# Patient Record
Sex: Male | Born: 1965 | Race: Black or African American | Hispanic: No | Marital: Married | Smoking: Never smoker
Health system: Southern US, Community
[De-identification: ages and names within clinical notes are randomized; demographics above are authoritative.]

## PROBLEM LIST (undated history)

## (undated) DIAGNOSIS — I639 Cerebral infarction, unspecified: Secondary | ICD-10-CM

## (undated) DIAGNOSIS — I10 Essential (primary) hypertension: Secondary | ICD-10-CM

## (undated) DIAGNOSIS — I2510 Atherosclerotic heart disease of native coronary artery without angina pectoris: Secondary | ICD-10-CM

## (undated) DIAGNOSIS — J329 Chronic sinusitis, unspecified: Secondary | ICD-10-CM

## (undated) HISTORY — PX: NASAL SINUS SURGERY: SHX719

## (undated) HISTORY — PX: KNEE ARTHROSCOPY: SUR90

## (undated) HISTORY — PX: VASECTOMY: SHX75

---

## 2010-02-05 ENCOUNTER — Emergency Department (HOSPITAL_COMMUNITY): Admission: EM | Admit: 2010-02-05 | Discharge: 2010-02-05 | Payer: Self-pay | Admitting: Emergency Medicine

## 2012-02-16 ENCOUNTER — Emergency Department (HOSPITAL_BASED_OUTPATIENT_CLINIC_OR_DEPARTMENT_OTHER)
Admission: EM | Admit: 2012-02-16 | Discharge: 2012-02-16 | Disposition: A | Discharge status: Home / Self Care | Payer: 59 | Attending: Emergency Medicine | Admitting: Emergency Medicine

## 2012-02-16 ENCOUNTER — Encounter (HOSPITAL_BASED_OUTPATIENT_CLINIC_OR_DEPARTMENT_OTHER): Payer: Self-pay | Admitting: Emergency Medicine

## 2012-02-16 DIAGNOSIS — I10 Essential (primary) hypertension: Secondary | ICD-10-CM | POA: Insufficient documentation

## 2012-02-16 DIAGNOSIS — Z79899 Other long term (current) drug therapy: Secondary | ICD-10-CM | POA: Insufficient documentation

## 2012-02-16 DIAGNOSIS — M549 Dorsalgia, unspecified: Secondary | ICD-10-CM | POA: Insufficient documentation

## 2012-02-16 DIAGNOSIS — E119 Type 2 diabetes mellitus without complications: Secondary | ICD-10-CM | POA: Insufficient documentation

## 2012-02-16 DIAGNOSIS — R109 Unspecified abdominal pain: Secondary | ICD-10-CM | POA: Insufficient documentation

## 2012-02-16 HISTORY — DX: Essential (primary) hypertension: I10

## 2012-02-16 HISTORY — DX: Cerebral infarction, unspecified: I63.9

## 2012-02-16 LAB — COMPREHENSIVE METABOLIC PANEL
Alkaline Phosphatase: 106 U/L (ref 39–117)
BUN: 7 mg/dL (ref 6–23)
GFR calc Af Amer: >90 mL/min (ref >90)
GFR calc non Af Amer: >90 mL/min (ref >90)
Glucose, Bld: 123 mg/dL — ABNORMAL HIGH (ref 70–99)
Potassium: 3.5 mEq/L (ref 3.5–5.1)
Total Bilirubin: 0.5 mg/dL (ref 0.3–1.2)
Total Protein: 8 g/dL (ref 6.0–8.3)

## 2012-02-16 LAB — DIFFERENTIAL
Basophils Absolute: 0 10*3/uL (ref 0.0–0.1)
Lymphocytes Relative: 44 % (ref 12–46)
Neutro Abs: 2.7 10*3/uL (ref 1.7–7.7)
Neutrophils Relative %: 40 % — ABNORMAL LOW (ref 43–77)

## 2012-02-16 LAB — CBC
HCT: 42.3 % (ref 39.0–52.0)
Platelets: 202 10*3/uL (ref 150–400)
RDW: 14.9 % (ref 11.5–15.5)
WBC: 6.7 10*3/uL (ref 4.0–10.5)

## 2012-02-16 MED ORDER — HYDROMORPHONE HCL PF 1 MG/ML IJ SOLN
1.0000 mg | Freq: Once | INTRAMUSCULAR | Status: AC
  Administered 2012-02-16: 1 mg via INTRAVENOUS
  Filled 2012-02-16: qty 1

## 2012-02-16 MED ORDER — OXYCODONE-ACETAMINOPHEN 5-325 MG PO TABS: 2.0000 | ORAL_TABLET | ORAL | Status: AC | PRN

## 2012-02-16 MED ORDER — SODIUM CHLORIDE 0.9 % IV SOLN
Freq: Once | INTRAVENOUS | Status: AC
  Administered 2012-02-16: 17:00:00 via INTRAVENOUS

## 2012-02-16 MED ORDER — ONDANSETRON HCL 4 MG/2ML IJ SOLN
4.0000 mg | Freq: Once | INTRAMUSCULAR | Status: AC
  Administered 2012-02-16: 4 mg via INTRAVENOUS
  Filled 2012-02-16: qty 2

## 2012-02-16 NOTE — ED Provider Notes (Signed)
History     CSN: [id number]  Arrival date & time 02/16/12  1516   First MD Initiated Contact with Patient 02/16/12 1606      Chief Complaint  Patient presents with  . Abdominal Pain    (Consider location/radiation/quality/duration/timing/severity/associated sxs/prior treatment) Patient is a 46 y.o. male presenting with abdominal pain. The history is provided by the patient. No language interpreter was used.  Abdominal Pain The primary symptoms of the illness include abdominal pain. The current episode started 2 days ago. The onset of the illness was sudden. The problem has been gradually worsening.  The illness is associated with eating. The patient has not had a change in bowel habit. Additional symptoms associated with the illness include back pain. Significant associated medical issues include liver disease. Significant associated medical issues do not include PUD, GERD, inflammatory bowel disease or diabetes.  Pt reports he began having abdominal pain after eating pizza.  Pt was seen at the [facility] and given hydrocodone.  Pt was told his liver functions were elevated.  Pt was advised that he needed an MRi to evaluate for gallbladder duct problems.  Pt reports no relief from pain medications  Past Medical History  Diagnosis Date  . Diabetes mellitus   . Hypertension   . Stroke     History reviewed. No pertinent past surgical history.  History reviewed. No pertinent family history.  History  Substance Use Topics  . Smoking status: Not on file  . Smokeless tobacco: Not on file  . Alcohol Use:       Review of Systems  Gastrointestinal: Positive for abdominal pain.  Musculoskeletal: Positive for back pain.  All other systems reviewed and are negative.    Allergies  Review of patient's allergies indicates no known allergies.  Home Medications   Current Outpatient Rx  Name Route Sig Dispense Refill  . AMLODIPINE BESYLATE 10 MG PO TABS Oral Take 5 mg by mouth daily.    Marland Kitchen  VITAMIN D 1000 UNITS PO TABS Oral Take 1,000 Units by mouth daily.    . CYANOCOBALAMIN 1000 MCG PO TABS Oral Take 1,000 mcg by mouth daily.    Marland Kitchen HYDROCODONE-ACETAMINOPHEN 5-500 MG PO TABS Oral Take 1 tablet by mouth 3 (three) times daily as needed. For pain    . LISINOPRIL 20 MG PO TABS Oral Take 10 mg by mouth daily.    Marland Kitchen METFORMIN HCL 500 MG PO TABS Oral Take 500 mg by mouth 2 (two) times daily.    Marland Kitchen ONDANSETRON HCL 8 MG PO TABS Oral Take 8 mg by mouth 3 (three) times daily as needed. For nausea    . OXYCODONE-ACETAMINOPHEN 5-325 MG PO TABS Oral Take 1 tablet by mouth every 4 (four) hours as needed. For pain    . PRAVASTATIN SODIUM 40 MG PO TABS Oral Take 40 mg by mouth every evening.    Marland Kitchen SERTRALINE HCL 100 MG PO TABS Oral Take 100 mg by mouth daily.    Marland Kitchen ZOLPIDEM TARTRATE 10 MG PO TABS Oral Take 5 mg by mouth at bedtime as needed. For sleep      BP 148/89  Pulse 83  Temp(Src) 98.4 F (36.9 C) (Oral)  Resp 22  SpO2 99%  Physical Exam  Nursing note and vitals reviewed. Constitutional: He appears well-developed and well-nourished.  HENT:  Head: Normocephalic and atraumatic.  Right Ear: External ear normal.  Left Ear: External ear normal.  Nose: Nose normal.  Mouth/Throat: Oropharynx is clear and moist.  Eyes:  Conjunctivae and EOM are normal. Pupils are equal, round, and reactive to light.  Neck: Normal range of motion. Neck supple.  Cardiovascular: Normal rate.   Pulmonary/Chest: Effort normal.  Abdominal: Soft.  Musculoskeletal: Normal range of motion.  Neurological: He is alert.  Skin: Skin is warm.  Psychiatric: He has a normal mood and affect.    ED Course  Procedures (including critical care time)  Labs Reviewed - No data to display No results found.   No diagnosis found.    MDM   Results for orders placed during the hospital encounter of 02/16/12  LIPASE, BLOOD      Component Value Range   Lipase 16  11 - 59 (U/L)  COMPREHENSIVE METABOLIC PANEL       Component Value Range   Sodium 138  135 - 145 (mEq/L)   Potassium 3.5  3.5 - 5.1 (mEq/L)   Chloride 101  96 - 112 (mEq/L)   CO2 30  19 - 32 (mEq/L)   Glucose, Bld 123 (*) 70 - 99 (mg/dL)   BUN 7  6 - 23 (mg/dL)   Creatinine, Ser 4.09  0.50 - 1.35 (mg/dL)   Calcium 9.3  8.4 - 81.1 (mg/dL)   Total Protein 8.0  6.0 - 8.3 (g/dL)   Albumin 3.8  3.5 - 5.2 (g/dL)   AST 35  0 - 37 (U/L)   ALT 94 (*) 0 - 53 (U/L)   Alkaline Phosphatase 106  39 - 117 (U/L)   Total Bilirubin 0.5  0.3 - 1.2 (mg/dL)   GFR calc non Af Amer >90  >90 (mL/min)   GFR calc Af Amer >90  >90 (mL/min)  CBC      Component Value Range   WBC 6.7  4.0 - 10.5 (K/uL)   RBC 5.17  4.22 - 5.81 (MIL/uL)   Hemoglobin 14.1  13.0 - 17.0 (g/dL)   HCT 91.4  78.2 - 95.6 (%)   MCV 81.8  78.0 - 100.0 (fL)   MCH 27.3  26.0 - 34.0 (pg)   MCHC 33.3  30.0 - 36.0 (g/dL)   RDW 21.3  08.6 - 57.8 (%)   Platelets 202  150 - 400 (K/uL)  DIFFERENTIAL      Component Value Range   Neutrophils Relative 40 (*) 43 - 77 (%)   Neutro Abs 2.7  1.7 - 7.7 (K/uL)   Lymphocytes Relative 44  12 - 46 (%)   Lymphs Abs 2.9  0.7 - 4.0 (K/uL)   Monocytes Relative 8  3 - 12 (%)   Monocytes Absolute 0.5  0.1 - 1.0 (K/uL)   Eosinophils Relative 8 (*) 0 - 5 (%)   Eosinophils Absolute 0.5  0.0 - 0.7 (K/uL)   Basophils Relative 0  0 - 1 (%)   Basophils Absolute 0.0  0.0 - 0.1 (K/uL)   No results found. Dr.Wickline in to see and examine.  [facility]  No gallstones,  They recommended mrcp.    [facility].        Lonia Skinner Robbins, [place] 02/16/12 1827

## 2012-02-16 NOTE — ED Notes (Signed)
Pt had abdominal CT on Friday with abnormalities, scheduled for MRI tomorrow at [facility].  Pt having pain uncontrolled by meds given on Friday.

## 2012-02-16 NOTE — ED Provider Notes (Signed)
Pt resting comfortably abd soft and no focal tenderness [facility], I doubt acute abd process He denies active CP/SOB BP 148/89  Pulse 83  Temp(Src) 98.4 F (36.9 C) (Oral)  Resp 22  SpO2 99%   Joya Gaskins, MD 02/16/12 1759

## 2012-02-16 NOTE — ED Notes (Signed)
Spoke with Kerby Nora for second time at [facility] about copies of records for pt., advised that pt. Was currently in ED being seen.

## 2012-02-16 NOTE — Discharge Instructions (Signed)

## 2012-02-16 NOTE — ED Notes (Addendum)
Pt told respiratory therapist that he wanted his IV moved from his [facility] to his hand because it was uncomfortable.  Upon entering room pt was found bent over the head of the bed with SO standing at bedside rubbing his back.  Pt states that pain is unbearable, no diaphoresis, no nausea, no vomiting, speaking in complete sentences.  Pt was informed that it would be advisable to leave PIV to avoid having to be restuck if his clinical situation dictated need for PIV in the [facility].  Pt voiced understanding and agreed to leave PIV in place at this time.

## 2012-02-17 NOTE — ED Provider Notes (Signed)
Medical screening examination/treatment/procedure(s) were conducted as a shared visit with non-physician practitioner(s) and myself.  I personally evaluated the patient during the encounter  Pt well appearing, no distress, abd soft, reports this has been going on for "awhile"  Joya Gaskins, MD 02/17/12 [phone]

## 2013-03-22 ENCOUNTER — Emergency Department (HOSPITAL_BASED_OUTPATIENT_CLINIC_OR_DEPARTMENT_OTHER)
Admission: EM | Admit: 2013-03-22 | Discharge: 2013-03-22 | Disposition: A | Discharge status: Home / Self Care | Payer: 59 | Attending: Emergency Medicine | Admitting: Emergency Medicine

## 2013-03-22 ENCOUNTER — Encounter (HOSPITAL_BASED_OUTPATIENT_CLINIC_OR_DEPARTMENT_OTHER): Payer: Self-pay | Admitting: *Deleted

## 2013-03-22 DIAGNOSIS — M545 Low back pain, unspecified: Secondary | ICD-10-CM | POA: Insufficient documentation

## 2013-03-22 DIAGNOSIS — Z7901 Long term (current) use of anticoagulants: Secondary | ICD-10-CM | POA: Insufficient documentation

## 2013-03-22 DIAGNOSIS — Z8673 Personal history of transient ischemic attack (TIA), and cerebral infarction without residual deficits: Secondary | ICD-10-CM | POA: Insufficient documentation

## 2013-03-22 DIAGNOSIS — Z79899 Other long term (current) drug therapy: Secondary | ICD-10-CM | POA: Insufficient documentation

## 2013-03-22 DIAGNOSIS — E119 Type 2 diabetes mellitus without complications: Secondary | ICD-10-CM | POA: Insufficient documentation

## 2013-03-22 DIAGNOSIS — I10 Essential (primary) hypertension: Secondary | ICD-10-CM | POA: Insufficient documentation

## 2013-03-22 LAB — URINALYSIS, ROUTINE W REFLEX MICROSCOPIC
Leukocytes, UA: NEGATIVE
Nitrite: NEGATIVE
Specific Gravity, Urine: 1.024 (ref 1.005–1.030)
Urobilinogen, UA: 1 mg/dL (ref 0.0–1.0)
pH: 6 (ref 5.0–8.0)

## 2013-03-22 MED ORDER — OXYCODONE-ACETAMINOPHEN 5-325 MG PO TABS: 1.0000 | ORAL_TABLET | Freq: Four times a day (QID) | ORAL | Status: DC | PRN

## 2013-03-22 MED ORDER — HYDROMORPHONE HCL PF 2 MG/ML IJ SOLN
2.0000 mg | Freq: Once | INTRAMUSCULAR | Status: AC
  Administered 2013-03-22: 2 mg via INTRAMUSCULAR
  Filled 2013-03-22: qty 1

## 2013-03-22 NOTE — ED Notes (Addendum)
Pt states he went to [employer] Friday and jumped on the trampoline about 10 min. "Woke up Sat crying". Has had Ibuprofen and Vicodin without relief. Denies any "new" numbness or tingling to ext. Pt is on blood thinner for "blood clots"

## 2013-03-22 NOTE — ED Provider Notes (Signed)
History     CSN: [id number]  Arrival date & time 03/22/13  0057   First MD Initiated Contact with Patient 03/22/13 0347      Chief Complaint  Patient presents with  . Back Pain    (Consider location/radiation/quality/duration/timing/severity/associated sxs/prior treatment) HPI This is a 47 year old male who was playing on a trampoline 2 days ago. Yesterday going he awoke with severe pain in his right flank and lower back. He describes the pain as "a catch". There is only slight radiation down the back of the right leg. The pain is worse with movement and certain positions. There is no associated numbness, weakness or bowel or bladder changes.  Past Medical History  Diagnosis Date  . Diabetes mellitus   . Hypertension   . Stroke     History reviewed. No pertinent past surgical history.  History reviewed. No pertinent family history.  History  Substance Use Topics  . Smoking status: Never Smoker   . Smokeless tobacco: Not on file  . Alcohol Use: Yes      Review of Systems  All other systems reviewed and are negative.    Allergies  Review of patient's allergies indicates no known allergies.  Home Medications   Current Outpatient Rx  Name  Route  Sig  Dispense  Refill  . warfarin (COUMADIN) 7.5 MG tablet   Oral   Take 7.5 mg by mouth daily.         Marland Kitchen amLODipine (NORVASC) 10 MG tablet   Oral   Take 5 mg by mouth daily.         . cholecalciferol (VITAMIN D) 1000 UNITS tablet   Oral   Take 1,000 Units by mouth daily.         . cyanocobalamin 1000 MCG tablet   Oral   Take 1,000 mcg by mouth daily.         Marland Kitchen HYDROcodone-acetaminophen (VICODIN) 5-500 MG per tablet   Oral   Take 1 tablet by mouth 3 (three) times daily as needed. For pain         . lisinopril (PRINIVIL,ZESTRIL) 20 MG tablet   Oral   Take 10 mg by mouth daily.         . metFORMIN (GLUCOPHAGE) 500 MG tablet   Oral   Take 500 mg by mouth 2 (two) times daily.         .  ondansetron (ZOFRAN) 8 MG tablet   Oral   Take 8 mg by mouth 3 (three) times daily as needed. For nausea         . oxyCODONE-acetaminophen (PERCOCET) 5-325 MG per tablet   Oral   Take 1 tablet by mouth every 4 (four) hours as needed. For pain         . pravastatin (PRAVACHOL) 40 MG tablet   Oral   Take 40 mg by mouth every evening.         . sertraline (ZOLOFT) 100 MG tablet   Oral   Take 100 mg by mouth daily.         Marland Kitchen zolpidem (AMBIEN) 10 MG tablet   Oral   Take 5 mg by mouth at bedtime as needed. For sleep           BP 138/81  Pulse 64  Temp(Src) 98.1 F (36.7 C) (Oral)  Resp 20  Ht 5\' 11"  (1.803 m)  Wt 238 lb (107.956 kg)  BMI 33.21 kg/m2  SpO2 96%  Physical Exam General: Well-developed, well-nourished  male in no acute distress; appearance consistent with age of record HENT: normocephalic, atraumatic Eyes: pupils equal round and reactive to light; extraocular muscles intact Neck: supple Heart: regular rate and rhythm Lungs: clear to auscultation bilaterally Abdomen: soft; nondistended; nontender; bowel sounds present Back: Nontender; pain on movement of the lower back; negative straight leg raise on the left, positive straight leg raise on the right with pain at about 45 Extremities: No deformity; full range of motion; pulses normal Neurologic: Awake, alert and oriented; motor function intact in all extremities and symmetric; no facial droop Skin: Warm and dry Psychiatric: Normal mood and affect    ED Course  Procedures (including critical care time)     MDM   Nursing notes and vitals signs, including pulse oximetry, reviewed.  Summary of this visit's results, reviewed by myself:  Labs:  Results for orders placed during the hospital encounter of 03/22/13 (from the past 24 hour(s))  URINALYSIS, ROUTINE W REFLEX MICROSCOPIC     Status: None   Collection Time    03/22/13  4:02 AM      Result Value Range   Color, Urine YELLOW  YELLOW    APPearance CLEAR  CLEAR   Specific Gravity, Urine 1.024  1.005 - 1.030   pH 6.0  5.0 - 8.0   Glucose, UA NEGATIVE  NEGATIVE mg/dL   Hgb urine dipstick NEGATIVE  NEGATIVE   Bilirubin Urine NEGATIVE  NEGATIVE   Ketones, ur NEGATIVE  NEGATIVE mg/dL   Protein, ur NEGATIVE  NEGATIVE mg/dL   Urobilinogen, UA 1.0  0.0 - 1.0 mg/dL   Nitrite NEGATIVE  NEGATIVE   Leukocytes, UA NEGATIVE  NEGATIVE           Hanley Seamen, MD 03/22/13 [phone]

## 2014-01-23 ENCOUNTER — Emergency Department (HOSPITAL_COMMUNITY): Payer: Non-veteran care

## 2014-01-23 ENCOUNTER — Encounter (HOSPITAL_COMMUNITY): Payer: Self-pay | Admitting: Emergency Medicine

## 2014-01-23 ENCOUNTER — Emergency Department (INDEPENDENT_AMBULATORY_CARE_PROVIDER_SITE_OTHER)
Admission: EM | Admit: 2014-01-23 | Discharge: 2014-01-23 | Disposition: A | Discharge status: Nursing Home (Includes ICF) | Payer: 59 | Source: Home / Self Care | Attending: Family Medicine | Admitting: Family Medicine

## 2014-01-23 ENCOUNTER — Emergency Department (HOSPITAL_COMMUNITY)
Admission: EM | Admit: 2014-01-23 | Discharge: 2014-01-23 | Disposition: A | Discharge status: Home / Self Care | Payer: Non-veteran care | Attending: Emergency Medicine | Admitting: Emergency Medicine

## 2014-01-23 DIAGNOSIS — E119 Type 2 diabetes mellitus without complications: Secondary | ICD-10-CM | POA: Insufficient documentation

## 2014-01-23 DIAGNOSIS — N132 Hydronephrosis with renal and ureteral calculous obstruction: Secondary | ICD-10-CM

## 2014-01-23 DIAGNOSIS — Z9852 Vasectomy status: Secondary | ICD-10-CM | POA: Insufficient documentation

## 2014-01-23 DIAGNOSIS — I10 Essential (primary) hypertension: Secondary | ICD-10-CM | POA: Insufficient documentation

## 2014-01-23 DIAGNOSIS — Z79899 Other long term (current) drug therapy: Secondary | ICD-10-CM | POA: Insufficient documentation

## 2014-01-23 DIAGNOSIS — R109 Unspecified abdominal pain: Secondary | ICD-10-CM

## 2014-01-23 DIAGNOSIS — R112 Nausea with vomiting, unspecified: Secondary | ICD-10-CM

## 2014-01-23 DIAGNOSIS — N201 Calculus of ureter: Secondary | ICD-10-CM | POA: Insufficient documentation

## 2014-01-23 DIAGNOSIS — N1330 Unspecified hydronephrosis: Secondary | ICD-10-CM | POA: Insufficient documentation

## 2014-01-23 DIAGNOSIS — I2510 Atherosclerotic heart disease of native coronary artery without angina pectoris: Secondary | ICD-10-CM | POA: Insufficient documentation

## 2014-01-23 DIAGNOSIS — R1110 Vomiting, unspecified: Secondary | ICD-10-CM

## 2014-01-23 DIAGNOSIS — Z7901 Long term (current) use of anticoagulants: Secondary | ICD-10-CM | POA: Insufficient documentation

## 2014-01-23 DIAGNOSIS — IMO0002 Reserved for concepts with insufficient information to code with codable children: Secondary | ICD-10-CM | POA: Insufficient documentation

## 2014-01-23 DIAGNOSIS — Z86718 Personal history of other venous thrombosis and embolism: Secondary | ICD-10-CM | POA: Insufficient documentation

## 2014-01-23 DIAGNOSIS — Z8709 Personal history of other diseases of the respiratory system: Secondary | ICD-10-CM | POA: Insufficient documentation

## 2014-01-23 DIAGNOSIS — Z792 Long term (current) use of antibiotics: Secondary | ICD-10-CM | POA: Insufficient documentation

## 2014-01-23 DIAGNOSIS — Z8673 Personal history of transient ischemic attack (TIA), and cerebral infarction without residual deficits: Secondary | ICD-10-CM | POA: Insufficient documentation

## 2014-01-23 HISTORY — DX: Atherosclerotic heart disease of native coronary artery without angina pectoris: I25.10

## 2014-01-23 HISTORY — DX: Chronic sinusitis, unspecified: J32.9

## 2014-01-23 LAB — POCT I-STAT, CHEM 8
BUN: 15 mg/dL (ref 6–23)
CALCIUM ION: 1.09 mmol/L — AB (ref 1.12–1.23)
CHLORIDE: 106 meq/L (ref 96–112)
Creatinine, Ser: 1.2 mg/dL (ref 0.50–1.35)
GLUCOSE: 188 mg/dL — AB (ref 70–99)
HCT: 44 % (ref 39.0–52.0)
Hemoglobin: 15 g/dL (ref 13.0–17.0)
Potassium: 4.1 mEq/L (ref 3.7–5.3)
Sodium: 142 mEq/L (ref 137–147)
TCO2: 25 mmol/L (ref 0–100)

## 2014-01-23 LAB — POCT URINALYSIS DIP (DEVICE)
Bilirubin Urine: NEGATIVE
GLUCOSE, UA: NEGATIVE mg/dL
Ketones, ur: NEGATIVE mg/dL
Leukocytes, UA: NEGATIVE
Nitrite: NEGATIVE
PH: 5.5 (ref 5.0–8.0)
PROTEIN: 30 mg/dL — AB
UROBILINOGEN UA: 0.2 mg/dL (ref 0.0–1.0)

## 2014-01-23 LAB — PROTIME-INR
INR: 2.53 — ABNORMAL HIGH (ref 0.00–1.49)
Prothrombin Time: 26.4 seconds — ABNORMAL HIGH (ref 11.6–15.2)

## 2014-01-23 LAB — CBC WITH DIFFERENTIAL/PLATELET
Basophils Absolute: 0 10*3/uL (ref 0.0–0.1)
Basophils Relative: 0 % (ref 0–1)
EOS ABS: 0.1 10*3/uL (ref 0.0–0.7)
EOS PCT: 1 % (ref 0–5)
HCT: 39.6 % (ref 39.0–52.0)
HEMOGLOBIN: 13.4 g/dL (ref 13.0–17.0)
LYMPHS ABS: 1 10*3/uL (ref 0.7–4.0)
Lymphocytes Relative: 13 % (ref 12–46)
MCH: 28 pg (ref 26.0–34.0)
MCHC: 33.8 g/dL (ref 30.0–36.0)
MCV: 82.8 fL (ref 78.0–100.0)
MONO ABS: 0.5 10*3/uL (ref 0.1–1.0)
MONOS PCT: 6 % (ref 3–12)
NEUTROS PCT: 79 % — AB (ref 43–77)
Neutro Abs: 5.7 10*3/uL (ref 1.7–7.7)
Platelets: 244 10*3/uL (ref 150–400)
RBC: 4.78 MIL/uL (ref 4.22–5.81)
RDW: 14.1 % (ref 11.5–15.5)
WBC: 7.2 10*3/uL (ref 4.0–10.5)

## 2014-01-23 LAB — BASIC METABOLIC PANEL
BUN: 15 mg/dL (ref 6–23)
CO2: 25 mEq/L (ref 19–32)
Calcium: 8.7 mg/dL (ref 8.4–10.5)
Chloride: 105 mEq/L (ref 96–112)
Creatinine, Ser: 1.13 mg/dL (ref 0.50–1.35)
GFR calc Af Amer: 88 mL/min — ABNORMAL LOW (ref >90)
GFR, EST NON AFRICAN AMERICAN: 76 mL/min — AB (ref >90)
GLUCOSE: 162 mg/dL — AB (ref 70–99)
POTASSIUM: 4.6 meq/L (ref 3.7–5.3)
Sodium: 142 mEq/L (ref 137–147)

## 2014-01-23 LAB — CBG MONITORING, ED: Glucose-Capillary: 175 mg/dL — ABNORMAL HIGH (ref 70–99)

## 2014-01-23 MED ORDER — ONDANSETRON HCL 4 MG/2ML IJ SOLN
INTRAMUSCULAR | Status: AC
  Filled 2014-01-23: qty 2

## 2014-01-23 MED ORDER — SODIUM CHLORIDE 0.9 % IV SOLN
Freq: Once | INTRAVENOUS | Status: AC
  Administered 2014-01-23: 12:00:00 via INTRAVENOUS

## 2014-01-23 MED ORDER — KETOROLAC TROMETHAMINE 60 MG/2ML IM SOLN
60.0000 mg | Freq: Once | INTRAMUSCULAR | Status: AC
  Administered 2014-01-23: 60 mg via INTRAMUSCULAR

## 2014-01-23 MED ORDER — OXYCODONE-ACETAMINOPHEN 5-325 MG PO TABS: 1.0000 | ORAL_TABLET | ORAL | Status: AC | PRN

## 2014-01-23 MED ORDER — HYDROMORPHONE HCL 1 MG/ML IJ SOLN
2.0000 mg | Freq: Once | INTRAMUSCULAR | Status: AC
  Administered 2014-01-23: 2 mg via INTRAMUSCULAR

## 2014-01-23 MED ORDER — TAMSULOSIN HCL 0.4 MG PO CAPS: 0.4000 mg | ORAL_CAPSULE | Freq: Every day | ORAL | Status: AC

## 2014-01-23 MED ORDER — KETOROLAC TROMETHAMINE 60 MG/2ML IM SOLN
INTRAMUSCULAR | Status: AC
  Filled 2014-01-23: qty 2

## 2014-01-23 MED ORDER — ONDANSETRON 4 MG PO TBDP: 4.0000 mg | ORAL_TABLET | Freq: Three times a day (TID) | ORAL | Status: AC | PRN

## 2014-01-23 MED ORDER — HYDROMORPHONE HCL PF 1 MG/ML IJ SOLN
INTRAMUSCULAR | Status: AC
Start: 2014-01-23 — End: 2014-01-23
  Filled 2014-01-23: qty 2

## 2014-01-23 MED ORDER — ONDANSETRON HCL 4 MG/2ML IJ SOLN
4.0000 mg | Freq: Once | INTRAMUSCULAR | Status: AC
  Administered 2014-01-23: 4 mg via INTRAMUSCULAR

## 2014-01-23 NOTE — ED Notes (Signed)
First contact with patient. Pt alert x4 respirations easy non labored.

## 2014-01-23 NOTE — Discharge Instructions (Signed)
Take the prescribed medication as directed to help control sx.   Strain all urine to monitor for passage of stone. Follow-up with [facility] if problems occur. Recommend following up with your primary care physician regarding CT scan findings.  They may wish for more detailed scan at a later date.  i have attached copy of report for their review. Return to the ED for new or worsening symptoms.

## 2014-01-23 NOTE — ED Provider Notes (Signed)
CSN: [id number]     Arrival date & time 01/23/14  1103 History   First MD Initiated Contact with Patient 01/23/14 1130     Chief Complaint  Patient presents with  . Flank Pain   (Consider location/radiation/quality/duration/timing/severity/associated sxs/prior Treatment) HPI Comments: 48 year old male with history of hypertension, diabetes, coronary artery disease, DVT, portal vein thrombosis, CVA, on lifelong Coumadin, presents for evaluation of left flank pain that began acutely this morning. He had a mild discomfort when he woke up. He went to urinate and had acute onset of severe 10 out of 10 pain. The pain has been constant and it worsens in waves. He has been vomiting as well. The pain is in his left flank and radiates around to the left groin area. He denies fever, chills, chest pain, shortness of breath. No history of similar episodes of pain. he has never smoked cigarettes. No history of kidney stones  Patient is a 48 y.o. male presenting with flank pain.  Flank Pain Associated symptoms include abdominal pain. Pertinent negatives include no chest pain and no shortness of breath.    Past Medical History  Diagnosis Date  . Diabetes mellitus   . Hypertension   . Stroke   . Sinus infection   . Coronary artery disease    History reviewed. No pertinent past surgical history. History reviewed. No pertinent family history. History  Substance Use Topics  . Smoking status: Never Smoker   . Smokeless tobacco: Not on file  . Alcohol Use: Yes    Review of Systems  Constitutional: Negative for fever, chills and fatigue.  HENT: Negative for sore throat.   Eyes: Negative for visual disturbance.  Respiratory: Negative for cough and shortness of breath.   Cardiovascular: Negative for chest pain, palpitations and leg swelling.  Gastrointestinal: Positive for abdominal pain. Negative for nausea, vomiting, diarrhea and constipation.  Genitourinary: Positive for flank pain and testicular  pain. Negative for dysuria, urgency, frequency and hematuria.  Musculoskeletal: Negative for arthralgias, myalgias, neck pain and neck stiffness.  Skin: Negative for rash.  Neurological: Negative for dizziness, weakness and light-headedness.    Allergies  Review of patient's allergies indicates no known allergies.  Home Medications   Current Outpatient Rx  Name  Route  Sig  Dispense  Refill  . amoxicillin (AMOXIL) 250 MG capsule   Oral   Take 250 mg by mouth 3 (three) times daily.         . fluticasone (VERAMYST) 27.5 MCG/SPRAY nasal spray   Nasal   Place 2 sprays into the nose daily.         Marland Kitchen. amLODipine (NORVASC) 10 MG tablet   Oral   Take 5 mg by mouth daily.         Marland Kitchen. buPROPion (ZYBAN) 150 MG 12 hr tablet   Oral   Take 150 mg by mouth 2 (two) times daily.         . cholecalciferol (VITAMIN D) 1000 UNITS tablet   Oral   Take 1,000 Units by mouth daily.         . cyanocobalamin 1000 MCG tablet   Oral   Take 1,000 mcg by mouth daily.         Marland Kitchen. HYDROcodone-acetaminophen (VICODIN) 5-500 MG per tablet   Oral   Take 1 tablet by mouth 3 (three) times daily as needed. For pain         . lisinopril (PRINIVIL,ZESTRIL) 20 MG tablet   Oral   Take 10 mg by  mouth daily.         . metFORMIN (GLUCOPHAGE) 500 MG tablet   Oral   Take 500 mg by mouth 2 (two) times daily.         . ondansetron (ZOFRAN) 8 MG tablet   Oral   Take 8 mg by mouth 3 (three) times daily as needed. For nausea         . oxyCODONE-acetaminophen (PERCOCET/ROXICET) 5-325 MG per tablet   Oral   Take 1-2 tablets by mouth every 6 (six) hours as needed for pain. For pain   30 tablet   0   . pravastatin (PRAVACHOL) 40 MG tablet   Oral   Take 40 mg by mouth every evening.         . sertraline (ZOLOFT) 100 MG tablet   Oral   Take 100 mg by mouth daily.         Marland Kitchen warfarin (COUMADIN) 7.5 MG tablet   Oral   Take 7.5 mg by mouth daily.         Marland Kitchen zolpidem (AMBIEN) 10 MG  tablet   Oral   Take 5 mg by mouth at bedtime as needed. For sleep          BP 177/85  Pulse 68  Temp(Src) 98.1 F (36.7 C) (Oral)  Resp 16  SpO2 100% Physical Exam  Nursing note and vitals reviewed. Constitutional: He is oriented to person, place, and time. He appears well-developed and well-nourished. He appears distressed.  in acute distress, writhing in the bed, actively vomiting in the room  HENT:  Head: Normocephalic.  Cardiovascular: Normal rate and regular rhythm.  Exam reveals distant heart sounds. Exam reveals no gallop and no friction rub.   No murmur heard. Pulmonary/Chest: Effort normal and breath sounds normal. No respiratory distress. He has no wheezes. He has no rales.  Abdominal: Soft. Normal appearance. Bowel sounds are absent. There is no hepatosplenomegaly. There is tenderness in the left lower quadrant. There is CVA tenderness (on the left.).  Neurological: He is alert and oriented to person, place, and time. Coordination normal.  Skin: Skin is warm and dry. No rash noted. He is not diaphoretic.  Psychiatric: He has a normal mood and affect. Judgment normal.    ED Course  Procedures (including critical care time) Labs Review Labs Reviewed  POCT URINALYSIS DIP (DEVICE) - Abnormal; Notable for the following:    Hgb urine dipstick TRACE (*)    Protein, ur 30 (*)    All other components within normal limits  POCT I-STAT, CHEM 8 - Abnormal; Notable for the following:    Glucose, Bld 188 (*)    Calcium, Ion 1.09 (*)    All other components within normal limits   Imaging Review No results found.  EKG non-ischemic   MDM   1. Flank pain   2. Vomiting    Given 4 mg zofran, 2 mg dilaudid, 60 mg toradol.  Pt has no Hx kidney stones and has significant comorbidities.  Pt would benefit from CT to confirm Dx and r/o alternative Dx.  Pt still in acute distress after medications, transferred to ED via [facility].      Graylon Good, PA-C 01/23/14 1214

## 2014-01-23 NOTE — ED Provider Notes (Signed)
Medical screening examination/treatment/procedure(s) were performed by non-physician practitioner and as supervising physician I was immediately available for consultation/collaboration.     Geoffery Lyonsouglas Cariann Kinnamon, MD 01/23/14 1536

## 2014-01-23 NOTE — ED Provider Notes (Signed)
CSN: [id number]     Arrival date & time 01/23/14  1230 History   First MD Initiated Contact with Patient 01/23/14 1242     Chief Complaint  Patient presents with  . Flank Pain    Left     (Consider location/radiation/quality/duration/timing/severity/associated sxs/prior Treatment) The history is provided by the patient and medical records.   This is a 48 year old male with past history significant for hypertension, diabetes, coronary artery disease, prior stroke, DVT, presenting to the ED from [facility] for further evaluation of left flank pain, onset this morning. Patient states he awoke and had some mild discomfort in his left back. States he went to go use the bathroom and after urinating he had severe pain in his left flank associated with nausea and vomiting.  States there is some radiation of pain to his left lower abdomen. He denies any dysuria or hematuria. He denies fever or chills. No prior history of kidney stones.  Patient is on chronic Coumadin therapy for prior DVT.  Patient given Dilaudid, Toradol, and Zofran at urgent care with significant improvement of his symptoms. Vital signs stable on arrival.  Past Medical History  Diagnosis Date  . Diabetes mellitus   . Hypertension   . Stroke   . Sinus infection   . Coronary artery disease    Past Surgical History  Procedure Laterality Date  . Vasectomy    . Nasal sinus surgery    . Knee arthroscopy      RT knee   History reviewed. No pertinent family history. History  Substance Use Topics  . Smoking status: Never Smoker   . Smokeless tobacco: Not on file  . Alcohol Use: Yes    Review of Systems  Gastrointestinal: Positive for nausea and vomiting.  Genitourinary: Positive for flank pain.  All other systems reviewed and are negative.    Allergies  Review of patient's allergies indicates no known allergies.  Home Medications   Current Outpatient Rx  Name  Route  Sig  Dispense  Refill  . amLODipine (NORVASC) 10 MG  tablet   Oral   Take 5 mg by mouth daily.         Marland Kitchen amoxicillin (AMOXIL) 250 MG capsule   Oral   Take 250 mg by mouth 3 (three) times daily.         Marland Kitchen buPROPion (ZYBAN) 150 MG 12 hr tablet   Oral   Take 150 mg by mouth 2 (two) times daily.         . cholecalciferol (VITAMIN D) 1000 UNITS tablet   Oral   Take 1,000 Units by mouth daily.         . cyanocobalamin 1000 MCG tablet   Oral   Take 1,000 mcg by mouth daily.         . fluticasone (VERAMYST) 27.5 MCG/SPRAY nasal spray   Nasal   Place 2 sprays into the nose daily.         Marland Kitchen HYDROcodone-acetaminophen (VICODIN) 5-500 MG per tablet   Oral   Take 1 tablet by mouth 3 (three) times daily as needed. For pain         . lisinopril (PRINIVIL,ZESTRIL) 20 MG tablet   Oral   Take 10 mg by mouth daily.         . metFORMIN (GLUCOPHAGE) 500 MG tablet   Oral   Take 500 mg by mouth 2 (two) times daily.         . ondansetron (ZOFRAN) 8 MG  tablet   Oral   Take 8 mg by mouth 3 (three) times daily as needed. For nausea         . oxyCODONE-acetaminophen (PERCOCET/ROXICET) 5-325 MG per tablet   Oral   Take 1-2 tablets by mouth every 6 (six) hours as needed for pain. For pain   30 tablet   0   . pravastatin (PRAVACHOL) 40 MG tablet   Oral   Take 40 mg by mouth every evening.         . sertraline (ZOLOFT) 100 MG tablet   Oral   Take 100 mg by mouth daily.         Marland Kitchen warfarin (COUMADIN) 7.5 MG tablet   Oral   Take 7.5 mg by mouth daily.         Marland Kitchen zolpidem (AMBIEN) 10 MG tablet   Oral   Take 5 mg by mouth at bedtime as needed. For sleep          BP 140/76  Pulse 72  Temp(Src) 97.7 F (36.5 C) (Oral)  Resp 16  SpO2 94%  Physical Exam  Nursing note and vitals reviewed. Constitutional: He is oriented to person, place, and time. He appears well-developed and well-nourished. No distress.  Lying comfortably in bed, in no acute distress  HENT:  Head: Normocephalic and atraumatic.  Mouth/Throat:  Oropharynx is clear and moist.  Eyes: Conjunctivae and EOM are normal. Pupils are equal, round, and reactive to light.  Neck: Normal range of motion. Neck supple.  Cardiovascular: Normal rate, regular rhythm and normal heart sounds.   Pulmonary/Chest: Effort normal and breath sounds normal. No respiratory distress. He has no wheezes.  Abdominal: Soft. Bowel sounds are normal. There is no tenderness. There is CVA tenderness. There is no guarding.  Abdomen soft, nondistended, left CVA tenderness  Musculoskeletal: Normal range of motion. He exhibits no edema.  Neurological: He is alert and oriented to person, place, and time.  Skin: Skin is warm and dry. He is not diaphoretic.  Psychiatric: He has a normal mood and affect.    ED Course  Procedures (including critical care time) Labs Review Labs Reviewed  CBC WITH DIFFERENTIAL - Abnormal; Notable for the following:    Neutrophils Relative % 79 (*)    All other components within normal limits  BASIC METABOLIC PANEL - Abnormal; Notable for the following:    Glucose, Bld 162 (*)    GFR calc non Af Amer 76 (*)    GFR calc Af Amer 88 (*)    All other components within normal limits  PROTIME-INR - Abnormal; Notable for the following:    Prothrombin Time 26.4 (*)    INR 2.53 (*)    All other components within normal limits  CBG MONITORING, ED - Abnormal; Notable for the following:    Glucose-Capillary 175 (*)    All other components within normal limits  URINALYSIS, ROUTINE W REFLEX MICROSCOPIC   Imaging Review Ct Abdomen Pelvis Wo Contrast  01/23/2014   CLINICAL DATA:  Acute onset left flank pain and vomiting.  EXAM: CT ABDOMEN AND PELVIS WITHOUT CONTRAST  TECHNIQUE: Multidetector CT imaging of the abdomen and pelvis was performed following the standard protocol without intravenous contrast.  COMPARISON:  None.  FINDINGS: A 1 mm calculus is seen in the lower pole the left kidney. Mild left hydronephrosis and ureterectasis is seen as well as  minimal left perinephric stranding. A 2 mm distal left ureteral calculus is seen at the ureterovesical junction. No  evidence of right-sided ureteral calculi or hydronephrosis.  Hepatic steatosis is seen. The liver appears heterogeneous and there are several areas of increased attenuation seen in the peripheral right hepatic lobe. This could represent areas of geographic fatty sparing, although liver masses cannot be excluded on this noncontrast study.  Noncontrast images of the liver, spleen, and adrenal glands are normal in appearance. No evidence of lymphadenopathy within the abdomen or pelvis. No pelvic masses identified. No evidence of inflammatory process or abscess.  IMPRESSION: Mild left hydronephrosis due to 2 mm distal left ureteral calculus at the ureterovesical junction.  1 mm nonobstructive calculus in lower pole of left kidney.  Suspect geographic pattern of fatty infiltration of the liver, although hepatic neoplasm cannot be excluded on this noncontrast study. Recommend nonemergent abdomen MRI without and with contrast for further evaluation.   Electronically Signed   By: Myles RosenthalJohn  Stahl M.D.   On: 01/23/2014 14:12     EKG Interpretation None      MDM   Final diagnoses:  Ureteral stone with hydronephrosis  Nausea & vomiting   Labs as above-- renal function preserved. INR is therapeutic.  Urinalysis urgent care reviewed, no signs of infection. CT revealed mild left hydronephrosis secondary to 2 mm distal left ureteral calculus UVJ.  CT scan also revealing questionable hepatic neoplasm, recommended nonemergent MRI for further evaluation -- patient and family have been made aware of the CT findings and need for followup with primary care physician with possible repeat scan or MRI.  Patient's symptoms have been well-controlled in the emergency department. He will be discharged with Percocet, Zofran, and Fosamax. He was instructed to strain all urine to monitor for passage of stone.  He will  followup with [facility] if problems occur.  Discussed plan with patient, he nausea and attending agreed with plan of care. Patient given copies of labs and imaging studies from today's ED visit.  Garlon HatchetLisa M Addison Whidbee, PA-C 01/23/14 1524

## 2014-01-23 NOTE — ED Provider Notes (Signed)
Medical screening examination/treatment/procedure(s) were performed by resident physician or non-physician practitioner and as supervising physician I was immediately available for consultation/collaboration.   KINDL,JAMES DOUGLAS MD.   James D Kindl, MD 01/23/14 1455 

## 2014-01-23 NOTE — ED Notes (Signed)
[facility] For kidney stone. Lt flank pain 2/10 no nausea.

## 2014-01-23 NOTE — ED Notes (Signed)
States sudden onset pain in flank this AM. Vomiting, slight diaphoresis

## 2014-04-24 ENCOUNTER — Ambulatory Visit (INDEPENDENT_AMBULATORY_CARE_PROVIDER_SITE_OTHER): Payer: 59 | Admitting: Internal Medicine

## 2014-04-24 VITALS — BP 120/78 | HR 76 | Temp 97.9°F | Resp 18 | Ht 70.5 in | Wt 248.6 lb

## 2014-04-24 DIAGNOSIS — B356 Tinea cruris: Secondary | ICD-10-CM

## 2014-04-24 MED ORDER — KETOCONAZOLE 2 % EX CREA: 1.0000 "application " | TOPICAL_CREAM | Freq: Two times a day (BID) | CUTANEOUS | Status: AC

## 2014-04-24 NOTE — Progress Notes (Signed)
   Subjective:    Patient ID: Jacob Deleon, male    DOB: [birth year], 48 y.o.   MRN: [id number]  HPI  Chief Complaint  Patient presents with  . Rash    around private area, itching on penis, wife has vaginal itching possible yeast infection from antibiotic    symptoms present for one week and are located on the shaft mainly No dysuria or discharge No diabetes   Review of Systems    noncontributory Objective:   Physical Exam  BP 120/78  Pulse 76  Temp(Src) 97.9 F (36.6 C) (Oral)  Resp 18  Ht 5' 10.5" (1.791 m)  Wt 248 lb 9.6 oz (112.764 kg)  BMI 35.15 kg/m2  SpO2 96% There is a red rash with slight maceration of the shaft around the corona No discrete ulcers      Assessment & Plan:  Yeast balanitis  Meds ordered this encounter  Medications  . ketoconazole (NIZORAL) 2 % cream    Sig: Apply 1 application topically 2 (two) times daily.    Dispense:  15 g    Refill:  0

## 2016-02-23 IMAGING — CT CT ABD-PELV W/O CM
2 of 4 series · 17 of 46 positions shown, 19 images · non-contrast
Comparison: None.

CLINICAL DATA: Acute onset left flank pain and vomiting.

EXAM:
CT ABDOMEN AND PELVIS WITHOUT CONTRAST
TECHNIQUE: Multidetector CT imaging of the abdomen and pelvis was performed
following the standard protocol without intravenous contrast.

[Series 2: stone study · axial · 0.95mm/px · z∈[+134,+564]mm · 14 of 94 slices shown, 16 images]
[im 4/94  soft-tissue]
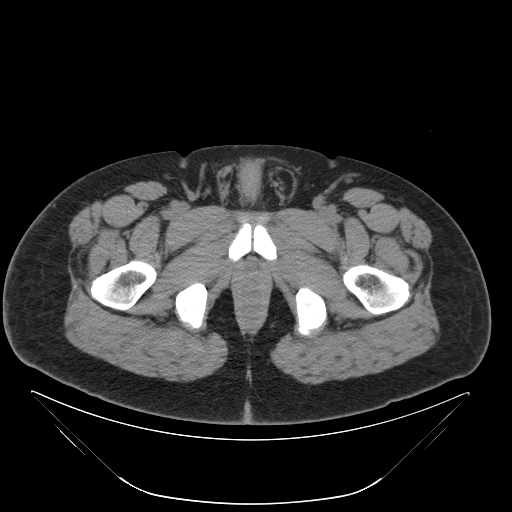
[im 4/94  bone]
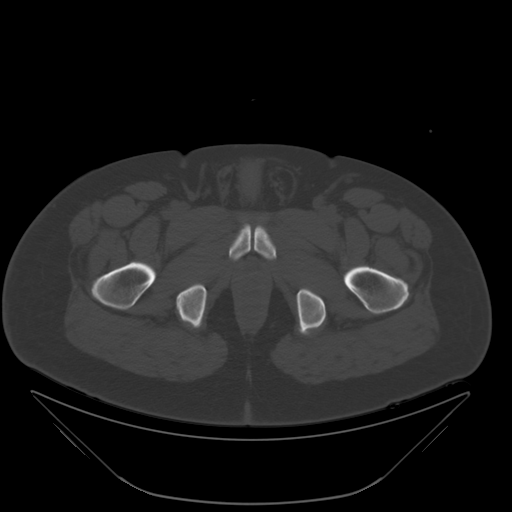
[im 12/94  soft-tissue]
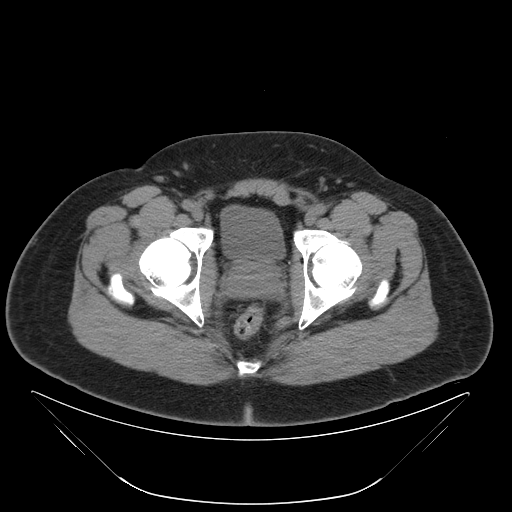
[im 19/94  soft-tissue]
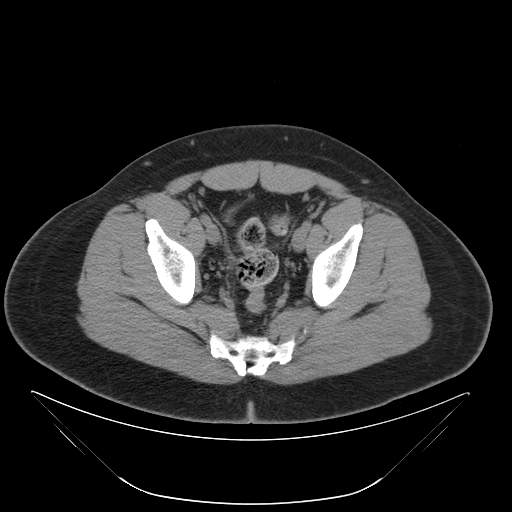
[im 27/94  soft-tissue]
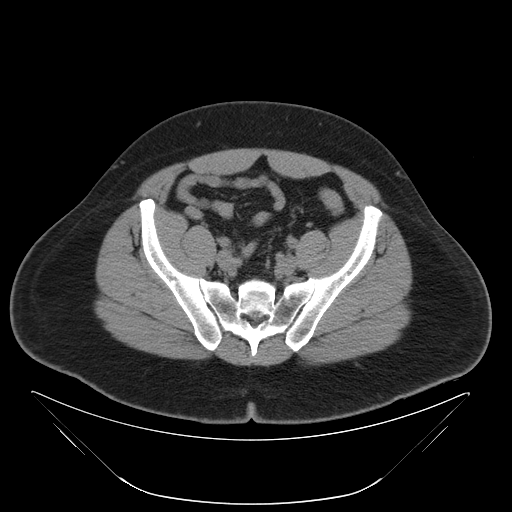
[im 30/94  soft-tissue]
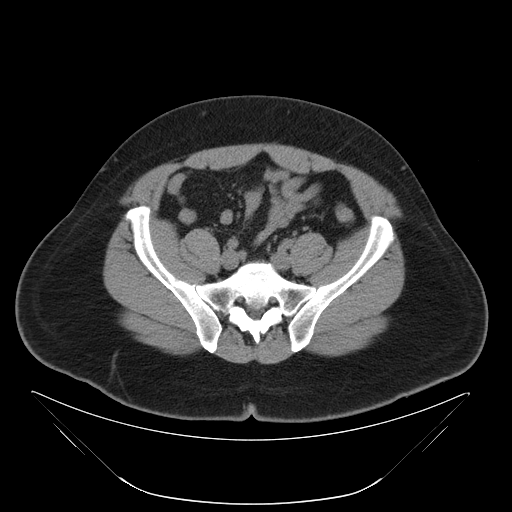
[im 38/94  soft-tissue]
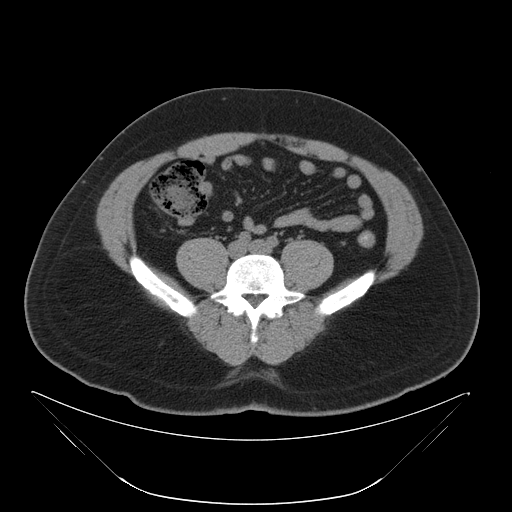
[im 45/94  soft-tissue]
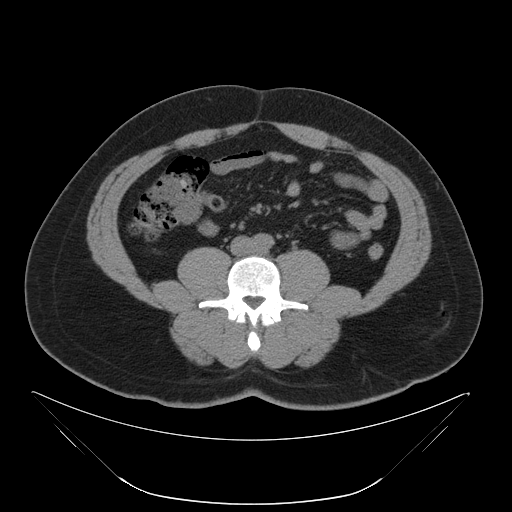
[im 49/94  soft-tissue]
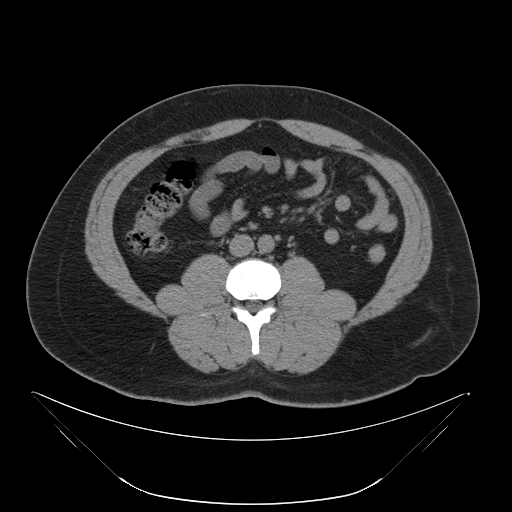
[im 56/94  soft-tissue]
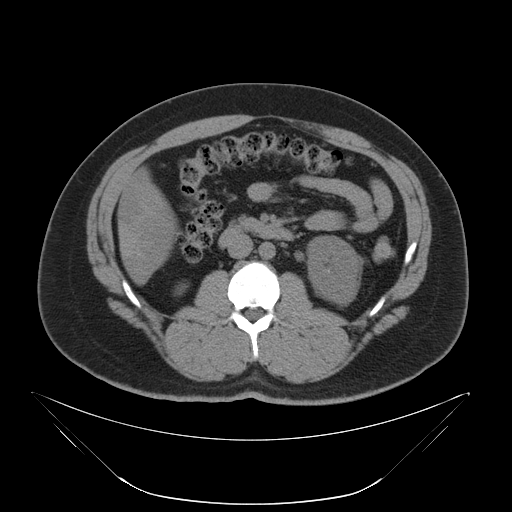
[im 56/94  bone]
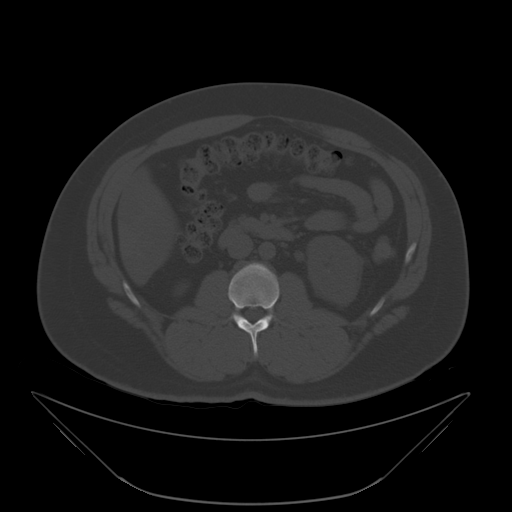
[im 64/94  soft-tissue]
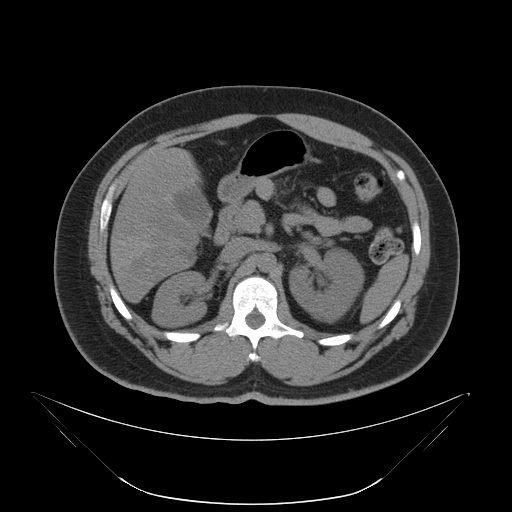
[im 71/94  soft-tissue]
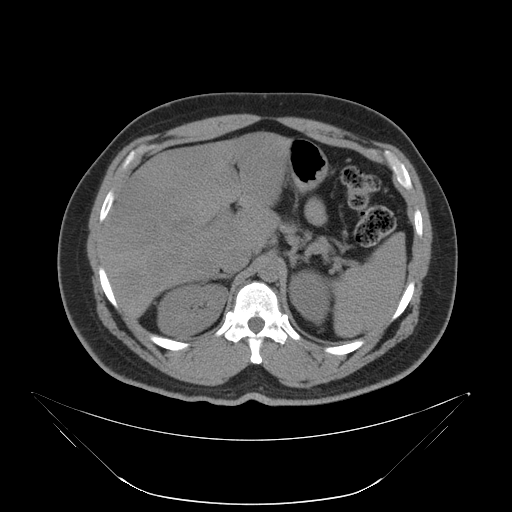
[im 75/94  soft-tissue]
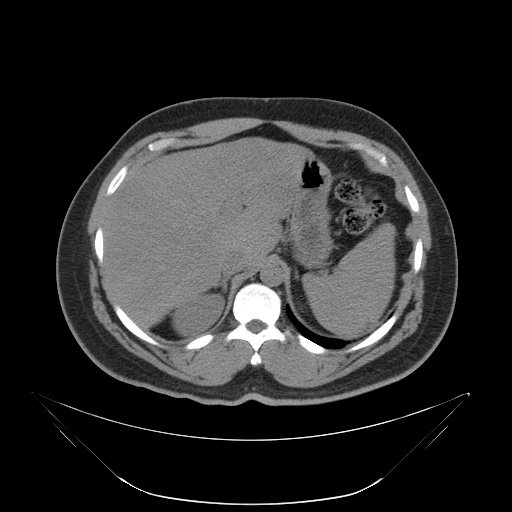
[im 82/94  soft-tissue]
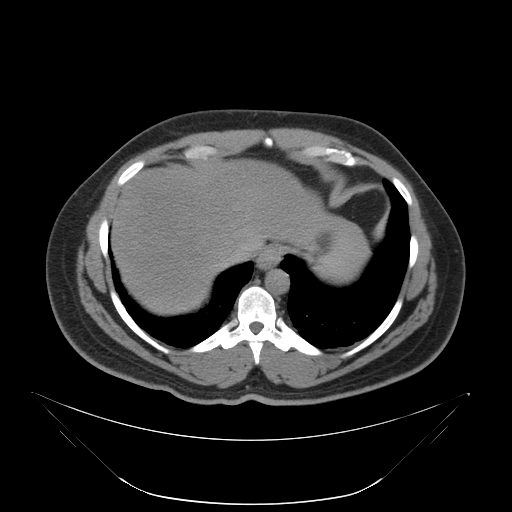
[im 90/94  soft-tissue]
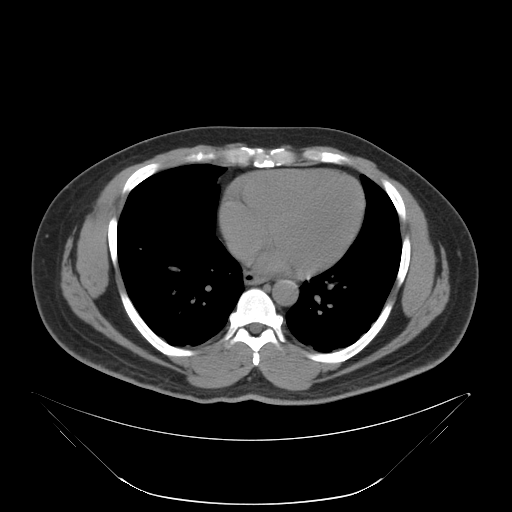

[mpr, coronals, coronal · coronal · 0.95mm/px · 3 of 107 slices shown]
[im 36/107  soft-tissue]
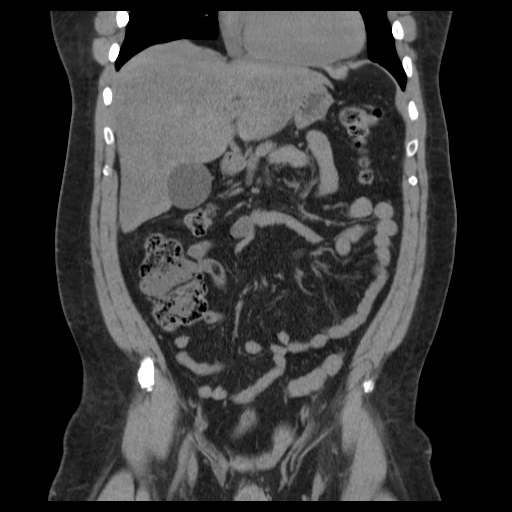
[im 48/107  soft-tissue]
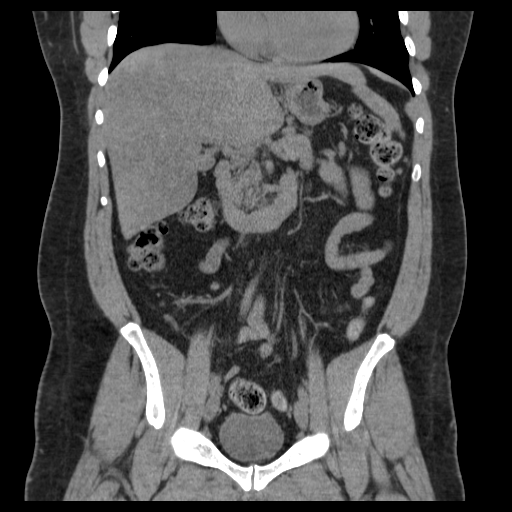
[im 59/107  soft-tissue]
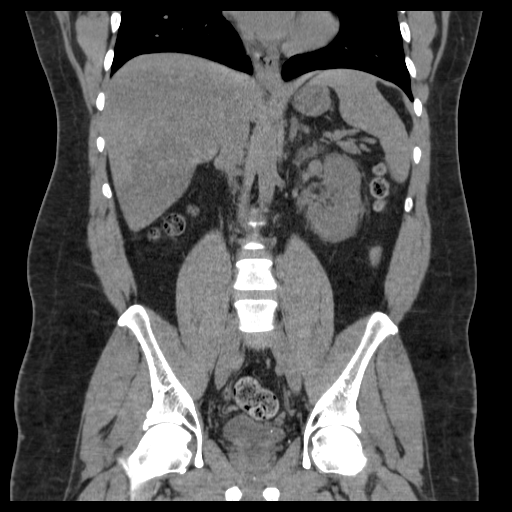

[17 of 46 positions shown; findings below may reference images not displayed]

FINDINGS: A 1 mm calculus is seen in the lower pole the left kidney. Mild left
hydronephrosis and ureterectasis is seen as well as minimal left
perinephric stranding. A 2 mm distal left ureteral calculus is seen
at the ureterovesical junction. No evidence of right-sided ureteral
calculi or hydronephrosis.

Hepatic steatosis is seen. The liver appears heterogeneous and there
are several areas of increased attenuation seen in the peripheral
right hepatic lobe. This could represent areas of geographic fatty
sparing, although liver masses cannot be excluded on this
noncontrast study.

Noncontrast images of the liver, spleen, and adrenal glands are
normal in appearance. No evidence of lymphadenopathy within the
abdomen or pelvis. No pelvic masses identified. No evidence of
inflammatory process or abscess.
IMPRESSION: Mild left hydronephrosis due to 2 mm distal left ureteral calculus
at the ureterovesical junction.

1 mm nonobstructive calculus in lower pole of left kidney.

Suspect geographic pattern of fatty infiltration of the liver,
although hepatic neoplasm cannot be excluded on this noncontrast
study. Recommend nonemergent abdomen MRI without and with contrast
for further evaluation.
